# Patient Record
Sex: Male | Born: 1945 | Race: Black or African American | Hispanic: No | Marital: Married | Smoking: Never smoker
Health system: Southern US, Community
[De-identification: ages and names within clinical notes are randomized; demographics above are authoritative.]

## PROBLEM LIST (undated history)

## (undated) DIAGNOSIS — M109 Gout, unspecified: Secondary | ICD-10-CM

## (undated) DIAGNOSIS — G4730 Sleep apnea, unspecified: Secondary | ICD-10-CM

## (undated) DIAGNOSIS — J302 Other seasonal allergic rhinitis: Secondary | ICD-10-CM

## (undated) DIAGNOSIS — I10 Essential (primary) hypertension: Secondary | ICD-10-CM

## (undated) HISTORY — PX: CIRCUMCISION: SUR203

## (undated) HISTORY — PX: PITUITARY EXCISION: SHX745

## (undated) HISTORY — PX: CATARACT EXTRACTION: SUR2

## (undated) HISTORY — PX: LAPAROSCOPIC GASTRIC BANDING: SHX1100

---

## 2012-03-28 ENCOUNTER — Encounter (HOSPITAL_BASED_OUTPATIENT_CLINIC_OR_DEPARTMENT_OTHER): Payer: Self-pay | Admitting: Emergency Medicine

## 2012-03-28 ENCOUNTER — Emergency Department (HOSPITAL_BASED_OUTPATIENT_CLINIC_OR_DEPARTMENT_OTHER): Payer: Medicare HMO

## 2012-03-28 ENCOUNTER — Emergency Department (HOSPITAL_BASED_OUTPATIENT_CLINIC_OR_DEPARTMENT_OTHER)
Admission: EM | Admit: 2012-03-28 | Discharge: 2012-03-28 | Disposition: A | Discharge status: Home / Self Care | Payer: Medicare HMO | Attending: Emergency Medicine | Admitting: Emergency Medicine

## 2012-03-28 DIAGNOSIS — Z9109 Other allergy status, other than to drugs and biological substances: Secondary | ICD-10-CM | POA: Insufficient documentation

## 2012-03-28 DIAGNOSIS — X58XXXA Exposure to other specified factors, initial encounter: Secondary | ICD-10-CM | POA: Insufficient documentation

## 2012-03-28 DIAGNOSIS — E119 Type 2 diabetes mellitus without complications: Secondary | ICD-10-CM | POA: Insufficient documentation

## 2012-03-28 DIAGNOSIS — I10 Essential (primary) hypertension: Secondary | ICD-10-CM | POA: Insufficient documentation

## 2012-03-28 DIAGNOSIS — G4730 Sleep apnea, unspecified: Secondary | ICD-10-CM | POA: Insufficient documentation

## 2012-03-28 DIAGNOSIS — S3130XA Unspecified open wound of scrotum and testes, initial encounter: Secondary | ICD-10-CM | POA: Insufficient documentation

## 2012-03-28 HISTORY — DX: Other seasonal allergic rhinitis: J30.2

## 2012-03-28 HISTORY — DX: Sleep apnea, unspecified: G47.30

## 2012-03-28 HISTORY — DX: Essential (primary) hypertension: I10

## 2012-03-28 HISTORY — DX: Gout, unspecified: M10.9

## 2012-03-28 LAB — URINALYSIS, ROUTINE W REFLEX MICROSCOPIC
Bilirubin Urine: NEGATIVE
Hgb urine dipstick: NEGATIVE
Protein, ur: NEGATIVE mg/dL
Urobilinogen, UA: 0.2 mg/dL (ref 0.0–1.0)

## 2012-03-28 MED ORDER — OXYCODONE-ACETAMINOPHEN 5-325 MG PO TABS: 2.0000 | ORAL_TABLET | ORAL | Status: AC | PRN

## 2012-03-28 MED ORDER — DOXYCYCLINE HYCLATE 100 MG PO CAPS: 100.0000 mg | ORAL_CAPSULE | Freq: Two times a day (BID) | ORAL | Status: AC

## 2012-03-28 NOTE — ED Notes (Signed)
Pt had boil on testicles that burst approximately 3 months ago.  Pt. placed bandaid on area to prevent pain and catch drainage.  Area developed scab and has been bothering pt. and is now seeking medical attention.  Area is draining currently.  Pt. also complains of continual urination at night for the past six months.  Pt. Stated that the doctor gave him medication for the urination but has not helped.  Wife states that the physician gave him Zolpidem for the problem but has not helped.

## 2012-03-28 NOTE — ED Provider Notes (Signed)
History     CSN: [id number]  Arrival date & time 03/28/12  1134   First MD Initiated Contact with Patient 03/28/12 1209      Chief Complaint  Patient presents with  . Recurrent Skin Infections    boil on testicle    (Consider location/radiation/quality/duration/timing/severity/associated sxs/prior treatment) Patient is a 66 y.o. male presenting with testicular pain. The history is provided by the patient. No language interpreter was used.  Testicle Pain This is a recurrent problem. Episode onset: 3 months. The problem occurs constantly. The problem has been gradually worsening. Pertinent negatives include no abdominal pain, change in bowel habit or weakness. Nothing aggravates the symptoms. Treatments tried: antibiotic. The treatment provided no relief.  Pt reports he has a sore on his scrotum.   Pt reports area burst and drained.   Dr. Roger Shelter treated him with an antibiotic.   Pt reports area is still open.   Area has not healed.     Past Medical History  Diagnosis Date  . Diabetes mellitus   . Hypertension   . Gout   . Seasonal allergies   . Sleep apnea     Past Surgical History  Procedure Date  . Pituitary excision   . Laparoscopic gastric banding   . Circumcision     History reviewed. No pertinent family history.  History  Substance Use Topics  . Smoking status: Never Smoker   . Smokeless tobacco: Never Used  . Alcohol Use: No      Review of Systems  Gastrointestinal: Negative for abdominal pain and change in bowel habit.  Genitourinary: Positive for testicular pain.  Neurological: Negative for weakness.  All other systems reviewed and are negative.    Allergies  Review of patient's allergies indicates no known allergies.  Home Medications   Current Outpatient Rx  Name Route Sig Dispense Refill  . DIOVAN PO Oral Take by mouth.    . ZOLPIDEM TARTRATE 10 MG PO TABS Oral Take by mouth.      BP 128/67  Pulse 125  Temp 98.4 F (36.9 C) (Oral)  Resp  24  Ht 6' (1.829 m)  Wt 487 lb (220.902 kg)  BMI 66.05 kg/m2  SpO2 93%  Physical Exam  Constitutional: He is oriented to person, place, and time. He appears well-developed.  HENT:  Head: Normocephalic and atraumatic.  Eyes: Pupils are equal, round, and reactive to light.  Neck: Normal range of motion. Neck supple.  Cardiovascular: Normal rate, regular rhythm and normal heart sounds.   Pulmonary/Chest: Effort normal.  Abdominal: Soft. Bowel sounds are normal. There is tenderness.  Genitourinary:       Quarter sized open wound,  No drainage,  2 other small open wounds,    Musculoskeletal: Normal range of motion.  Neurological: He is alert and oriented to person, place, and time. He has normal reflexes.  Skin: Skin is warm and dry.  Psychiatric: He has a normal mood and affect.    ED Course  Procedures (including critical care time)  Labs Reviewed - No data to display No results found.   1. Wound, open, scrotum or testes       MDM  No cellulitis,  Nonhealing wound.   Ultrasound shows hydrocele but no other mass.   I will treat with doxycycline to cover infection.   I advised follow up with Dr. Marcello Fennel for urine flow problems.   See Dr. Roger Shelter for wound recheck/rechecks.        Elson Areas, PA  03/28/12 1425  Lonia Skinner [place], [place] 03/28/12 1500

## 2012-03-28 NOTE — ED Notes (Signed)
Gave pt. Water to drink to aid in urination

## 2012-03-29 NOTE — ED Provider Notes (Signed)
Medical screening examination/treatment/procedure(s) were performed by non-physician practitioner and as supervising physician I was immediately available for consultation/collaboration.  Toy Baker, MD 03/29/12 [phone]

## 2012-11-02 ENCOUNTER — Emergency Department (HOSPITAL_BASED_OUTPATIENT_CLINIC_OR_DEPARTMENT_OTHER)
Admission: EM | Admit: 2012-11-02 | Discharge: 2012-11-02 | Disposition: A | Discharge status: Home / Self Care | Payer: Medicare HMO | Attending: Emergency Medicine | Admitting: Emergency Medicine

## 2012-11-02 ENCOUNTER — Encounter (HOSPITAL_BASED_OUTPATIENT_CLINIC_OR_DEPARTMENT_OTHER): Payer: Self-pay

## 2012-11-02 DIAGNOSIS — N498 Inflammatory disorders of other specified male genital organs: Secondary | ICD-10-CM | POA: Insufficient documentation

## 2012-11-02 DIAGNOSIS — Z794 Long term (current) use of insulin: Secondary | ICD-10-CM | POA: Insufficient documentation

## 2012-11-02 DIAGNOSIS — Z23 Encounter for immunization: Secondary | ICD-10-CM | POA: Insufficient documentation

## 2012-11-02 DIAGNOSIS — Z79899 Other long term (current) drug therapy: Secondary | ICD-10-CM | POA: Insufficient documentation

## 2012-11-02 DIAGNOSIS — I10 Essential (primary) hypertension: Secondary | ICD-10-CM | POA: Insufficient documentation

## 2012-11-02 DIAGNOSIS — E119 Type 2 diabetes mellitus without complications: Secondary | ICD-10-CM | POA: Insufficient documentation

## 2012-11-02 DIAGNOSIS — W19XXXA Unspecified fall, initial encounter: Secondary | ICD-10-CM | POA: Insufficient documentation

## 2012-11-02 DIAGNOSIS — Z8639 Personal history of other endocrine, nutritional and metabolic disease: Secondary | ICD-10-CM | POA: Insufficient documentation

## 2012-11-02 DIAGNOSIS — Y929 Unspecified place or not applicable: Secondary | ICD-10-CM | POA: Insufficient documentation

## 2012-11-02 DIAGNOSIS — Y939 Activity, unspecified: Secondary | ICD-10-CM | POA: Insufficient documentation

## 2012-11-02 DIAGNOSIS — Z043 Encounter for examination and observation following other accident: Secondary | ICD-10-CM | POA: Insufficient documentation

## 2012-11-02 DIAGNOSIS — L0390 Cellulitis, unspecified: Secondary | ICD-10-CM

## 2012-11-02 DIAGNOSIS — Z862 Personal history of diseases of the blood and blood-forming organs and certain disorders involving the immune mechanism: Secondary | ICD-10-CM | POA: Insufficient documentation

## 2012-11-02 MED ORDER — TETANUS-DIPHTH-ACELL PERTUSSIS 5-2.5-18.5 LF-MCG/0.5 IM SUSP
0.5000 mL | Freq: Once | INTRAMUSCULAR | Status: AC
  Administered 2012-11-02: 0.5 mL via INTRAMUSCULAR
  Filled 2012-11-02: qty 0.5

## 2012-11-02 MED ORDER — DOXYCYCLINE HYCLATE 100 MG PO CAPS: 100.0000 mg | ORAL_CAPSULE | Freq: Two times a day (BID) | ORAL | Status: AC

## 2012-11-02 NOTE — ED Provider Notes (Signed)
History     CSN: [id number]  Arrival date & time 11/02/12  1454   First MD Initiated Contact with Patient 11/02/12 1512      Chief Complaint  Patient presents with  . Fall  . Abscess    (Consider location/radiation/quality/duration/timing/severity/associated sxs/prior treatment) HPI Comments: Patient presents with sores on his scrotal area. He states it's been coming on for about 3 weeks now. He states he had it once before in September and it cleared up after a course of antibiotics. he's he states it's been draining and also bleeding intermittently. Patient denies any fevers or chills. He denies any nausea or vomiting. On the triage note he was also complaining of a fall. He apparently had a fall 4 days ago but he denies any injuries from the fall. He denies any complaints of pain currently other than the sores in his scrotum. Patient is morbidly obese and is only ambulatory with assistance.  Patient is a 67 y.o. male presenting with fall and abscess.  Fall Pertinent negatives include no fever, no numbness, no abdominal pain, no nausea, no vomiting, no hematuria and no headaches.  Abscess Associated symptoms: no fatigue, no fever, no headaches, no nausea and no vomiting     Past Medical History  Diagnosis Date  . Diabetes mellitus   . Hypertension   . Gout   . Seasonal allergies   . Sleep apnea     Past Surgical History  Procedure Laterality Date  . Pituitary excision    . Laparoscopic gastric banding    . Circumcision      No family history on file.  History  Substance Use Topics  . Smoking status: Never Smoker   . Smokeless tobacco: Never Used  . Alcohol Use: No      Review of Systems  Constitutional: Negative for fever, chills, diaphoresis and fatigue.  HENT: Negative for congestion, rhinorrhea and sneezing.   Eyes: Negative.   Respiratory: Negative for cough, chest tightness and shortness of breath.   Cardiovascular: Negative for chest pain and leg  swelling.  Gastrointestinal: Negative for nausea, vomiting, abdominal pain, diarrhea and blood in stool.  Genitourinary: Negative for frequency, hematuria, flank pain and difficulty urinating.  Musculoskeletal: Negative for back pain and arthralgias.  Skin: Positive for wound. Negative for rash.  Neurological: Negative for dizziness, speech difficulty, weakness, numbness and headaches.    Allergies  Review of patient's allergies indicates no known allergies.  Home Medications   Current Outpatient Rx  Name  Route  Sig  Dispense  Refill  . insulin NPH-regular (NOVOLIN 70/30) (70-30) 100 UNIT/ML injection   Subcutaneous   Inject into the skin.         Marland Kitchen METFORMIN HCL PO   Oral   Take by mouth.         . TORSEMIDE PO   Oral   Take by mouth.         Marland Kitchen UNKNOWN TO PATIENT      Pt did not bring in meds-wife was able to list some-not  all-knows he takes BP meds         . doxycycline (VIBRAMYCIN) 100 MG capsule   Oral   Take 1 capsule (100 mg total) by mouth 2 (two) times daily.   20 capsule   0   . Valsartan (DIOVAN PO)   Oral   Take by mouth.         . zolpidem (AMBIEN) 10 MG tablet   Oral   Take  by mouth.           BP 107/60  Pulse 118  Temp(Src) 98 F (36.7 C) (Oral)  Resp 20  Ht 6' (1.829 m)  Wt 487 lb (220.902 kg)  BMI 66.03 kg/m2  SpO2 100%  Physical Exam  Constitutional: He is oriented to person, place, and time. He appears well-developed and well-nourished.  HENT:  Head: Normocephalic and atraumatic.  Eyes: Pupils are equal, round, and reactive to light.  Neck: Normal range of motion. Neck supple.  Cardiovascular: Normal rate, regular rhythm and normal heart sounds.   Pulmonary/Chest: Effort normal and breath sounds normal. No respiratory distress. He has no wheezes. He has no rales. He exhibits no tenderness.  Abdominal: Soft. Bowel sounds are normal. There is no tenderness. There is no rebound and no guarding.  Musculoskeletal: Normal  range of motion. He exhibits no edema.  Lymphadenopathy:    He has no cervical adenopathy.  Neurological: He is alert and oriented to person, place, and time.  Skin: Skin is warm and dry. No rash noted.  Patient has markedly enlarged scrotal sac which is chronic per the patient. He has some skin breakdown on the underside of his scrotum  is mildly erythematous. There is some serous sanguinous discharge. There is no induration or fluctuance. There is no surrounding cellulitis. There's no tenderness to his testicles. There is no extension into the perirectal area  Psychiatric: He has a normal mood and affect.    ED Course  Procedures (including critical care time)  Labs Reviewed - No data to display No results found.   1. Cellulitis       MDM  Patient with a small area of skin breakdown in his scrotal sac. He does state he's had this one time in the past and on review of records she was treated with doxycycline which seemed to clear it up. He did say that took a long time for it to heal. He appears to be an area that is rubbing when he moves around. A protective dressing was placed to the area. I will start him on doxycycline. His tetanus shot was updated. I advised him to followup with his primary care physician within the next 2-3 days for recheck. Advised to return here for symptoms worsen.        Rolan Bucco, MD 11/02/12 1538

## 2012-11-02 NOTE — ED Notes (Signed)
pt has "boils on is testicle" x 2 weeks-also c/o fall x 4 days pt states "i'm just sore from them pulling me up not hurting from the fall"

## 2013-05-29 ENCOUNTER — Encounter (HOSPITAL_BASED_OUTPATIENT_CLINIC_OR_DEPARTMENT_OTHER): Payer: Self-pay | Admitting: Emergency Medicine

## 2013-05-29 ENCOUNTER — Emergency Department (HOSPITAL_BASED_OUTPATIENT_CLINIC_OR_DEPARTMENT_OTHER)
Admission: EM | Admit: 2013-05-29 | Discharge: 2013-05-29 | Disposition: A | Discharge status: Home / Self Care | Payer: Medicare Other | Attending: Emergency Medicine | Admitting: Emergency Medicine

## 2013-05-29 DIAGNOSIS — Z862 Personal history of diseases of the blood and blood-forming organs and certain disorders involving the immune mechanism: Secondary | ICD-10-CM | POA: Insufficient documentation

## 2013-05-29 DIAGNOSIS — Z79899 Other long term (current) drug therapy: Secondary | ICD-10-CM | POA: Insufficient documentation

## 2013-05-29 DIAGNOSIS — L0390 Cellulitis, unspecified: Secondary | ICD-10-CM

## 2013-05-29 DIAGNOSIS — F430 Acute stress reaction: Secondary | ICD-10-CM | POA: Insufficient documentation

## 2013-05-29 DIAGNOSIS — E119 Type 2 diabetes mellitus without complications: Secondary | ICD-10-CM | POA: Insufficient documentation

## 2013-05-29 DIAGNOSIS — Z8639 Personal history of other endocrine, nutritional and metabolic disease: Secondary | ICD-10-CM | POA: Insufficient documentation

## 2013-05-29 DIAGNOSIS — I10 Essential (primary) hypertension: Secondary | ICD-10-CM | POA: Insufficient documentation

## 2013-05-29 DIAGNOSIS — Z794 Long term (current) use of insulin: Secondary | ICD-10-CM | POA: Insufficient documentation

## 2013-05-29 DIAGNOSIS — G4730 Sleep apnea, unspecified: Secondary | ICD-10-CM | POA: Insufficient documentation

## 2013-05-29 MED ORDER — OXYCODONE-ACETAMINOPHEN 5-325 MG PO TABS
2.0000 | ORAL_TABLET | Freq: Once | ORAL | Status: AC
  Administered 2013-05-29: 2 via ORAL
  Filled 2013-05-29: qty 2

## 2013-05-29 MED ORDER — METRONIDAZOLE 500 MG PO TABS: 500.0000 mg | ORAL_TABLET | Freq: Two times a day (BID) | ORAL | Status: AC

## 2013-05-29 MED ORDER — OXYCODONE-ACETAMINOPHEN 5-325 MG PO TABS: 2.0000 | ORAL_TABLET | ORAL | Status: DC | PRN

## 2013-05-29 MED ORDER — CIPROFLOXACIN HCL 500 MG PO TABS
500.0000 mg | ORAL_TABLET | Freq: Once | ORAL | Status: AC
Start: 2013-05-29 — End: 2013-05-29
  Administered 2013-05-29: 500 mg via ORAL
  Filled 2013-05-29: qty 1

## 2013-05-29 MED ORDER — LEVOFLOXACIN 500 MG PO TABS: 500.0000 mg | ORAL_TABLET | Freq: Every day | ORAL | Status: AC

## 2013-05-29 NOTE — ED Notes (Signed)
rx x 3 sent to pharmacy- pt advised where to pick up medications- pt has a ride at bedside

## 2013-05-29 NOTE — ED Provider Notes (Signed)
CSN: [id number]     Arrival date & time 05/29/13  1047 History   First MD Initiated Contact with Patient 05/29/13 1114     Chief Complaint  Patient presents with  . Recurrent Skin Infections    HPI  This is a patient with a complain of some pain in the posterior aspect of the scrotum. He has had a history of soft tissue infection some in his groin. Never in his axilla. Some perirectal and perirenal. He had a formal procedure the operating room several years ago for incision and drainage of a rectal or pelvic abscess. Last to 3 days he noticed a little bit of drainage on his shorts has some discomfort in the posterior aspect of the scrotum. He is an insulin-dependent diabetic. He is 490 pounds.  Past Medical History  Diagnosis Date  . Diabetes mellitus   . Hypertension   . Gout   . Seasonal allergies   . Sleep apnea    Past Surgical History  Procedure Laterality Date  . Pituitary excision    . Laparoscopic gastric banding    . Circumcision    . Cataract extraction     No family history on file. History  Substance Use Topics  . Smoking status: Never Smoker   . Smokeless tobacco: Never Used  . Alcohol Use: No    Review of Systems  Constitutional: Negative for fever, chills, diaphoresis, appetite change and fatigue.  HENT: Negative for mouth sores, sore throat and trouble swallowing.   Eyes: Negative for visual disturbance.  Respiratory: Negative for cough, chest tightness, shortness of breath and wheezing.   Cardiovascular: Negative for chest pain.  Gastrointestinal: Negative for nausea, vomiting, abdominal pain, diarrhea and abdominal distention.  Endocrine: Negative for polydipsia, polyphagia and polyuria.  Genitourinary: Negative for dysuria, frequency and hematuria.  Musculoskeletal: Negative for gait problem.  Skin: Positive for wound. Negative for color change, pallor and rash.       Skin breakdown the posterior aspect of the scrotum per his family members report   Neurological: Negative for dizziness, syncope, light-headedness and headaches.  Hematological: Does not bruise/bleed easily.  Psychiatric/Behavioral: Negative for behavioral problems and confusion.    Allergies  Review of patient's allergies indicates no known allergies.  Home Medications   Current Outpatient Rx  Name  Route  Sig  Dispense  Refill  . doxycycline (VIBRAMYCIN) 100 MG capsule   Oral   Take 1 capsule (100 mg total) by mouth 2 (two) times daily.   20 capsule   0   . insulin NPH-regular (NOVOLIN 70/30) (70-30) 100 UNIT/ML injection   Subcutaneous   Inject into the skin.         Marland Kitchen levofloxacin (LEVAQUIN) 500 MG tablet   Oral   Take 1 tablet (500 mg total) by mouth daily.   10 tablet   0   . METFORMIN HCL PO   Oral   Take by mouth.         . metroNIDAZOLE (FLAGYL) 500 MG tablet   Oral   Take 1 tablet (500 mg total) by mouth 2 (two) times daily.   20 tablet   0   . oxyCODONE-acetaminophen (PERCOCET/ROXICET) 5-325 MG per tablet   Oral   Take 2 tablets by mouth every 4 (four) hours as needed.   6 tablet   0   . TORSEMIDE PO   Oral   Take by mouth.         Marland Kitchen UNKNOWN TO PATIENT  Pt did not bring in meds-wife was able to list some-not  all-knows he takes BP meds         . Valsartan (DIOVAN PO)   Oral   Take by mouth.         . zolpidem (AMBIEN) 10 MG tablet   Oral   Take by mouth.          BP 144/95  Pulse 115  Temp(Src) 97.8 F (36.6 C) (Oral)  Resp 20  Ht 6' (1.829 m)  Wt 490 lb (222.263 kg)  BMI 66.44 kg/m2  SpO2 97% Physical Exam  Constitutional: He is oriented to person, place, and time. He appears well-developed and well-nourished. No distress.  Large stature adult male. Has difficulty getting in and out of bed based on his size  HENT:  Head: Normocephalic.  Eyes: Conjunctivae are normal. Pupils are equal, round, and reactive to light. No scleral icterus.  Neck: Normal range of motion. Neck supple. No thyromegaly  present.  Cardiovascular: Normal rate and regular rhythm.  Exam reveals no gallop and no friction rub.   No murmur heard. Pulmonary/Chest: Effort normal and breath sounds normal. No respiratory distress. He has no wheezes. He has no rales.  Abdominal: Soft. Bowel sounds are normal. He exhibits no distension. There is no tenderness. There is no rebound.  Morbid obesity no anterior abdominal wall abnormalities. Anterior scrotum appears normal cornea appears normal  Genitourinary:  He has a lot of redundant tissue in his perineum. Is able to examine him in the left lateral decubitus position and in the standing position. I do not see evidence of Fournier's. I do not see obvious skin breakdown or cellulitis. There is an area less than the size of a dime or a some granulation tissue on the left most posterior hemiscrotum. This is skin is not indurated. There is no palpable fluctuance. There is no palpable drainage. No visible sinus. The remainder of the skin does not appear compromised. I do not find perirectal, perianal, or buttock abscesses on exam.  Musculoskeletal: Normal range of motion.  Neurological: He is alert and oriented to person, place, and time.  Skin: Skin is warm and dry. No rash noted.  Psychiatric: He has a normal mood and affect. His behavior is normal.    ED Course  Procedures (including critical care time) Labs Review Labs Reviewed - No data to display Imaging Review No results found.  EKG Interpretation   None       MDM   1. Cellulitis    Careful exam shows some early breakdown of extreme posterior aspect of the scrotal skin to the left. I do not find evidence of additional skin breakdown that would suggest fornier's gangrene.  No additional signs of cellulitis. No palpable perianal, or perirectal abscesses. 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  He is diabetic and he is nearly 500 pounds and has  a lot of redundant tissue in this area. I do not see any sign that would suggest a need imaging, constipation or additional treatment right now. Have asked that he be compliant with his antibiotics. He may see his primary care physician within the next 2-3 days if this is not continuing to improve.    Roney Marion, MD 05/29/13 1200

## 2013-05-29 NOTE — ED Notes (Signed)
Pt reports he has cyst to perineal area- hx of same

## 2013-05-29 NOTE — ED Notes (Signed)
MD at bedside. 

## 2014-05-22 ENCOUNTER — Encounter (HOSPITAL_BASED_OUTPATIENT_CLINIC_OR_DEPARTMENT_OTHER): Payer: Medicare HMO

## 2014-05-23 ENCOUNTER — Emergency Department (HOSPITAL_COMMUNITY)
Admission: EM | Admit: 2014-05-23 | Discharge: 2014-05-23 | Disposition: A | Discharge status: Home / Self Care | Payer: Medicare Other | Attending: Emergency Medicine | Admitting: Emergency Medicine

## 2014-05-23 ENCOUNTER — Encounter (HOSPITAL_COMMUNITY): Payer: Self-pay | Admitting: *Deleted

## 2014-05-23 ENCOUNTER — Emergency Department (HOSPITAL_COMMUNITY): Payer: Medicare Other

## 2014-05-23 DIAGNOSIS — I10 Essential (primary) hypertension: Secondary | ICD-10-CM | POA: Insufficient documentation

## 2014-05-23 DIAGNOSIS — L089 Local infection of the skin and subcutaneous tissue, unspecified: Secondary | ICD-10-CM | POA: Diagnosis present

## 2014-05-23 DIAGNOSIS — Z8709 Personal history of other diseases of the respiratory system: Secondary | ICD-10-CM | POA: Insufficient documentation

## 2014-05-23 DIAGNOSIS — L97509 Non-pressure chronic ulcer of other part of unspecified foot with unspecified severity: Secondary | ICD-10-CM

## 2014-05-23 DIAGNOSIS — Z792 Long term (current) use of antibiotics: Secondary | ICD-10-CM | POA: Insufficient documentation

## 2014-05-23 DIAGNOSIS — L97519 Non-pressure chronic ulcer of other part of right foot with unspecified severity: Secondary | ICD-10-CM | POA: Diagnosis not present

## 2014-05-23 DIAGNOSIS — L89152 Pressure ulcer of sacral region, stage 2: Secondary | ICD-10-CM | POA: Insufficient documentation

## 2014-05-23 DIAGNOSIS — Z794 Long term (current) use of insulin: Secondary | ICD-10-CM | POA: Insufficient documentation

## 2014-05-23 DIAGNOSIS — M109 Gout, unspecified: Secondary | ICD-10-CM | POA: Insufficient documentation

## 2014-05-23 DIAGNOSIS — Z79899 Other long term (current) drug therapy: Secondary | ICD-10-CM | POA: Diagnosis not present

## 2014-05-23 DIAGNOSIS — E11621 Type 2 diabetes mellitus with foot ulcer: Secondary | ICD-10-CM | POA: Diagnosis not present

## 2014-05-23 DIAGNOSIS — L97529 Non-pressure chronic ulcer of other part of left foot with unspecified severity: Secondary | ICD-10-CM | POA: Insufficient documentation

## 2014-05-23 DIAGNOSIS — G4730 Sleep apnea, unspecified: Secondary | ICD-10-CM | POA: Diagnosis not present

## 2014-05-23 DIAGNOSIS — L8990 Pressure ulcer of unspecified site, unspecified stage: Secondary | ICD-10-CM

## 2014-05-23 DIAGNOSIS — Z7982 Long term (current) use of aspirin: Secondary | ICD-10-CM | POA: Diagnosis not present

## 2014-05-23 LAB — BASIC METABOLIC PANEL
ANION GAP: 11 (ref 5–15)
BUN: 43 mg/dL — ABNORMAL HIGH (ref 6–23)
CALCIUM: 9.2 mg/dL (ref 8.4–10.5)
CHLORIDE: 102 meq/L (ref 96–112)
CO2: 28 mEq/L (ref 19–32)
CREATININE: 1.6 mg/dL — AB (ref 0.50–1.35)
GFR calc non Af Amer: 43 mL/min — ABNORMAL LOW (ref >90)
GFR, EST AFRICAN AMERICAN: 50 mL/min — AB (ref >90)
Glucose, Bld: 56 mg/dL — ABNORMAL LOW (ref 70–99)
Potassium: 5 mEq/L (ref 3.7–5.3)
SODIUM: 141 meq/L (ref 137–147)

## 2014-05-23 LAB — CBC WITH DIFFERENTIAL/PLATELET
BASOS ABS: 0.1 10*3/uL (ref 0.0–0.1)
BASOS PCT: 1 % (ref 0–1)
EOS PCT: 2 % (ref 0–5)
Eosinophils Absolute: 0.2 10*3/uL (ref 0.0–0.7)
HEMATOCRIT: 32.8 % — AB (ref 39.0–52.0)
Hemoglobin: 10.1 g/dL — ABNORMAL LOW (ref 13.0–17.0)
Lymphocytes Relative: 20 % (ref 12–46)
Lymphs Abs: 1.8 10*3/uL (ref 0.7–4.0)
MCH: 27.9 pg (ref 26.0–34.0)
MCHC: 30.8 g/dL (ref 30.0–36.0)
MCV: 90.6 fL (ref 78.0–100.0)
MONO ABS: 0.5 10*3/uL (ref 0.1–1.0)
Monocytes Relative: 6 % (ref 3–12)
NEUTROS ABS: 6.2 10*3/uL (ref 1.7–7.7)
Neutrophils Relative %: 71 % (ref 43–77)
PLATELETS: 304 10*3/uL (ref 150–400)
RBC: 3.62 MIL/uL — ABNORMAL LOW (ref 4.22–5.81)
RDW: 15.1 % (ref 11.5–15.5)
WBC: 8.7 10*3/uL (ref 4.0–10.5)

## 2014-05-23 LAB — CBG MONITORING, ED: GLUCOSE-CAPILLARY: 46 mg/dL — AB (ref 70–99)

## 2014-05-23 MED ORDER — AMOXICILLIN-POT CLAVULANATE 875-125 MG PO TABS: 1.0000 | ORAL_TABLET | Freq: Two times a day (BID) | ORAL | Status: AC

## 2014-05-23 MED ORDER — OXYCODONE-ACETAMINOPHEN 5-325 MG PO TABS: 1.0000 | ORAL_TABLET | Freq: Four times a day (QID) | ORAL | Status: AC | PRN

## 2014-05-23 MED ORDER — HYDROCODONE-ACETAMINOPHEN 5-325 MG PO TABS
2.0000 | ORAL_TABLET | Freq: Once | ORAL | Status: AC
  Administered 2014-05-23: 2 via ORAL
  Filled 2014-05-23: qty 2

## 2014-05-23 NOTE — ED Notes (Signed)
EDP at bedside  

## 2014-05-23 NOTE — Discharge Instructions (Signed)
Soak feet in warm water, use moist soaks on pressure ulcers. Follow-up with your primary care physician, and follow-up with the wound clinic. Return to the ER if you develop worsening of symptoms, high fever, nausea, vomiting  Pressure Ulcer A pressure ulcer is a sore that has formed from the breakdown of skin and exposure of deeper layers of tissue. It develops in areas of the body where there is unrelieved pressure. Pressure ulcers are usually found over a bony area, such as the shoulder blades, spine, lower back, hips, knees, ankles, and heels. Pressure ulcers vary in severity. Your health care provider may determine the severity (stage) of your pressure ulcer. The stages include:  Stage I--The skin is red, and when the skin is pressed, it stays red.  Stage II--The top layer of skin is gone, and there is a shallow, pink ulcer.  Stage III--The ulcer becomes deeper, and it is more difficult to see the whole wound. Also, there may be yellow or brown parts, as well as pink and red parts.  Stage IV--The ulcer may be deep and red, pink, brown, white, or yellow. Bone or muscle may be seen.  Unstageable pressure ulcer--The ulcer is covered almost completely with black, brown, or yellow tissue. It is not known how deep the ulcer is or what stage it is until this covering comes off.  Suspected deep tissue injury--A person's skin can be injured from pressure or pulling on the skin when his or her position is changed. The skin appears purple or maroon. There may not be an opening in the skin, but there could be a blood-filled blister. This deep tissue injury is often difficult to see in people with darker skin tones. The site may open and become deeper in time. However, early interventions will help the area heal and may prevent the area from opening. CAUSES  Pressure ulcers are caused by pressure against the skin that limits the flow of blood to the skin and nearby tissues. There are many risk factors that  can lead to pressure sores. RISK FACTORS  Decreased ability to move.  Decreased ability to feel pain or discomfort.  Excessive skin moisture from urine, stool, sweat, or secretions.  Poor nutrition.  Dehydration.  Tobacco, drug, or alcohol abuse.  Having someone pull on bedsheets that are under you, such as when health care workers are changing your position in a hospital bed.  Obesity.  Increased adult age.  Hospitalization in a critical care unit for longer than 4 days with use of medical devices.  Prolonged use of medical devices.  Critical illness.  Anemia.  Traumatic brain injury.  Spinal cord injury.  Stroke.  Diabetes.  Poor blood glucose control.  Low blood pressure (hypotension).  Low oxygen levels.  Medicines that reduce blood flow.  Infection. DIAGNOSIS  Your health care provider will diagnose your pressure ulcer based on its appearance. The health care provider may determine the stage of your pressure ulcer as well. Tests may be done to check for infection, to assess your circulation, or to check for other diseases, such as diabetes. TREATMENT  Treatment of your pressure ulcer begins with determining what stage the ulcer is in. Your treatment team may include your health care provider, a wound care specialist, a nutritionist, a physical therapist, and a Psychologist, sport and exercise. Possible treatments may include:   Moving or repositioning every 1-2 hours.  Using beds or mattresses to shift your body weight and pressure points frequently.  Improving your diet.  Cleaning and  bandaging (dressing) the open wound.  Giving antibiotic medicines.  Removing damaged tissue.  Surgery and sometimes skin grafts. HOME CARE INSTRUCTIONS  If you were hospitalized, follow the care plan that was started in the hospital.  Avoid staying in the same position for more than 2 hours. Use padding, devices, or mattresses to cushion your pressure points as directed by your health  care provider.  Eat a well-balanced diet. Take nutritional supplements and vitamins as directed by your health care provider.  Keep all follow-up appointments.  Only take over-the-counter or prescription medicines for pain, fever, or discomfort as directed by your health care provider. SEEK MEDICAL CARE IF:   Your pressure ulcer is not improving.  You do not know how to care for your pressure ulcer.  You notice other areas of redness on your skin.  You have a fever. SEEK IMMEDIATE MEDICAL CARE IF:   You have increasing redness, swelling, or pain in your pressure ulcer.  You notice pus coming from your pressure ulcer.  You notice a bad smell coming from the wound or dressing.  Your pressure ulcer opens up again. Document Released: 07/05/2005 Document Revised: 07/10/2013 Document Reviewed: 03/12/2013 [facility] Patient Information 2015 [place], [place]. This information is not intended to replace advice given to you by your health care provider. Make sure you discuss any questions you have with your health care provider.  Diabetes and Foot Care Diabetes may cause you to have problems because of poor blood supply (circulation) to your feet and legs. This may cause the skin on your feet to become thinner, break easier, and heal more slowly. Your skin may become dry, and the skin may peel and crack. You may also have nerve damage in your legs and feet causing decreased feeling in them. You may not notice minor injuries to your feet that could lead to infections or more serious problems. Taking care of your feet is one of the most important things you can do for yourself.  HOME CARE INSTRUCTIONS  Wear shoes at all times, even in the house. Do not go barefoot. Bare feet are easily injured.  Check your feet daily for blisters, cuts, and redness. If you cannot see the bottom of your feet, use a mirror or ask someone for help.  Wash your feet with warm water (do not use hot water) and mild  soap. Then pat your feet and the areas between your toes until they are completely dry. Do not soak your feet as this can dry your skin.  Apply a moisturizing lotion or petroleum jelly (that does not contain alcohol and is unscented) to the skin on your feet and to dry, brittle toenails. Do not apply lotion between your toes.  Trim your toenails straight across. Do not dig under them or around the cuticle. File the edges of your nails with an emery board or nail file.  Do not cut corns or calluses or try to remove them with medicine.  Wear clean socks or stockings every day. Make sure they are not too tight. Do not wear knee-high stockings since they may decrease blood flow to your legs.  Wear shoes that fit properly and have enough cushioning. To break in new shoes, wear them for just a few hours a day. This prevents you from injuring your feet. Always look in your shoes before you put them on to be sure there are no objects inside.  Do not cross your legs. This may decrease the blood flow to your  feet.  If you find a minor scrape, cut, or break in the skin on your feet, keep it and the skin around it clean and dry. These areas may be cleansed with mild soap and water. Do not cleanse the area with peroxide, alcohol, or iodine.  When you remove an adhesive bandage, be sure not to damage the skin around it.  If you have a wound, look at it several times a day to make sure it is healing.  Do not use heating pads or hot water bottles. They may burn your skin. If you have lost feeling in your feet or legs, you may not know it is happening until it is too late.  Make sure your health care provider performs a complete foot exam at least annually or more often if you have foot problems. Report any cuts, sores, or bruises to your health care provider immediately. SEEK MEDICAL CARE IF:   You have an injury that is not healing.  You have cuts or breaks in the skin.  You have an ingrown  nail.  You notice redness on your legs or feet.  You feel burning or tingling in your legs or feet.  You have pain or cramps in your legs and feet.  Your legs or feet are numb.  Your feet always feel cold. SEEK IMMEDIATE MEDICAL CARE IF:   There is increasing redness, swelling, or pain in or around a wound.  There is a red line that goes up your leg.  Pus is coming from a wound.  You develop a fever or as directed by your health care provider.  You notice a bad smell coming from an ulcer or wound. Document Released: 07/02/2000 Document Revised: 03/07/2013 Document Reviewed: 12/12/2012 [facility] Patient Information 2015 [place], [place]. This information is not intended to replace advice given to you by your health care provider. Make sure you discuss any questions you have with your health care provider.

## 2014-05-23 NOTE — ED Provider Notes (Signed)
CSN: [id number]     Arrival date & time 05/23/14  1248 History   First MD Initiated Contact with Patient 05/23/14 1625     Chief Complaint  Patient presents with  . Wound Infection     (Consider location/radiation/quality/duration/timing/severity/associated sxs/prior Treatment) HPI Patient is a 68 year old male with past medical history ofdiabetes, hypertension, gout who presents the ER complaining of multiple sacral wounds as well has multiple foot wounds on multiple toes. Patient states his sacral wounds haven't present for approximately one year, and have "come and gone for a year". Patient states his sacral wounds have become worse over the past week, and he has also noticed multiple wounds on his feet which has become worse over the past week after they were being rubbed with a bandage over them. Patient denies numbness, weakness, tingling, blurred vision, dizziness, headache, nausea, vomiting, fever, chest pain, shortness of breath, abdominal pain, diarrhea, dysuria.  Past Medical History  Diagnosis Date  . Diabetes mellitus   . Hypertension   . Gout   . Seasonal allergies   . Sleep apnea    Past Surgical History  Procedure Laterality Date  . Pituitary excision    . Laparoscopic gastric banding    . Circumcision    . Cataract extraction     History reviewed. No pertinent family history. History  Substance Use Topics  . Smoking status: Never Smoker   . Smokeless tobacco: Never Used  . Alcohol Use: No    Review of Systems  Constitutional: Negative for fever.  HENT: Negative for trouble swallowing.   Eyes: Negative for visual disturbance.  Respiratory: Negative for shortness of breath.   Cardiovascular: Negative for chest pain.  Gastrointestinal: Negative for nausea, vomiting and abdominal pain.  Genitourinary: Negative for dysuria.  Musculoskeletal: Negative for neck pain.  Skin: Positive for wound. Negative for rash.  Neurological: Negative for dizziness, weakness  and numbness.  Psychiatric/Behavioral: Negative.       Allergies  Review of patient's allergies indicates no known allergies.  Home Medications   Prior to Admission medications   Medication Sig Start Date End Date Taking? Authorizing Provider  allopurinol (ZYLOPRIM) 300 MG tablet Take 300 mg by mouth daily.   Yes Historical Provider, MD  aspirin EC 81 MG tablet Take 81 mg by mouth daily.   Yes Historical Provider, MD  furosemide (LASIX) 40 MG tablet Take 40 mg by mouth daily.   Yes Historical Provider, MD  HYDROcodone-acetaminophen (NORCO) 7.5-325 MG per tablet Take 1 tablet by mouth every 6 (six) hours as needed for moderate pain.   Yes Historical Provider, MD  insulin NPH-regular Human (NOVOLIN 70/30) (70-30) 100 UNIT/ML injection Inject 25-70 Units into the skin 2 (two) times daily with a meal. 70 units in am and 25 units in the pm.   Yes Historical Provider, MD  metFORMIN (GLUCOPHAGE) 1000 MG tablet Take 1,000 mg by mouth 2 (two) times daily with a meal.   Yes Historical Provider, MD  Metolazone (ZAROXOLYN PO) Take 1 tablet by mouth daily. Patient does not know dose.   Yes Historical Provider, MD  metoprolol (LOPRESSOR) 50 MG tablet Take 50 mg by mouth 2 (two) times daily.   Yes Historical Provider, MD  mupirocin ointment (BACTROBAN) 2 % Place 1 application into the nose 3 (three) times daily.   Yes Historical Provider, MD  Olmesartan-Amlodipine-HCTZ (TRIBENZOR) 20-5-12.5 MG TABS Take 1 tablet by mouth daily.   Yes Historical Provider, MD  phentermine 37.5 MG capsule Take 37.5 mg by  mouth daily.   Yes Historical Provider, MD  zolpidem (AMBIEN) 10 MG tablet Take 10 mg by mouth at bedtime as needed for sleep.   Yes Historical Provider, MD  amoxicillin-clavulanate (AUGMENTIN) 875-125 MG per tablet Take 1 tablet by mouth 2 (two) times daily. One po bid x 7 days 05/23/14   Carrie Mew, PA-C  doxycycline (VIBRAMYCIN) 100 MG capsule Take 1 capsule (100 mg total) by mouth 2 (two) times daily.  11/02/12   Malvin Johns, MD  insulin NPH-regular (NOVOLIN 70/30) (70-30) 100 UNIT/ML injection Inject into the skin.    Historical Provider, MD  levofloxacin (LEVAQUIN) 500 MG tablet Take 1 tablet (500 mg total) by mouth daily. 05/29/13   Tanna Furry, MD  METFORMIN HCL PO Take by mouth.    Historical Provider, MD  metroNIDAZOLE (FLAGYL) 500 MG tablet Take 1 tablet (500 mg total) by mouth 2 (two) times daily. 05/29/13   Tanna Furry, MD  oxyCODONE-acetaminophen (PERCOCET/ROXICET) 5-325 MG per tablet Take 1-2 tablets by mouth every 6 (six) hours as needed. 05/23/14   Carrie Mew, PA-C  TORSEMIDE PO Take by mouth.    Historical Provider, MD  UNKNOWN TO PATIENT Pt did not bring in meds-wife was able to list some-not  all-knows he takes BP meds    Historical Provider, MD  Valsartan (DIOVAN PO) Take by mouth.    Historical Provider, MD  zolpidem (AMBIEN) 10 MG tablet Take by mouth.    Historical Provider, MD   BP 125/58 mmHg  Pulse 92  Temp(Src) 97.8 F (36.6 C) (Oral)  Resp 24  SpO2 97% Physical Exam  Constitutional: He is oriented to person, place, and time. He appears well-developed and well-nourished. No distress.  Patient is a morbidly obese male sitting in a upright in a wheelchair in no acute distress.  HENT:  Head: Normocephalic and atraumatic.  Mouth/Throat: Oropharynx is clear and moist. No oropharyngeal exudate.  Eyes: Right eye exhibits no discharge. Left eye exhibits no discharge. No scleral icterus.  Neck: Normal range of motion.  Cardiovascular: Normal rate, regular rhythm and normal heart sounds.   No murmur heard. Pulmonary/Chest: Effort normal and breath sounds normal. No respiratory distress.  Abdominal: Soft. There is no tenderness.  Musculoskeletal: Normal range of motion. He exhibits no edema or tenderness.  Neurological: He is alert and oriented to person, place, and time. No cranial nerve deficit. Coordination normal.  Skin: Skin is warm and dry. No rash noted. He is  not diaphoretic.     3 stage II sacral ulcers noted in the region of patient's initial tuberosities bilaterally. No obvious abscess, induration. Also noted are multiple wounds to patient's great toes bilaterally, and lateral borders of fifth toe bilaterally.  Psychiatric: He has a normal mood and affect.  Nursing note and vitals reviewed.   ED Course  Procedures (including critical care time) Labs Review Labs Reviewed  CBC WITH DIFFERENTIAL - Abnormal; Notable for the following:    RBC 3.62 (*)    Hemoglobin 10.1 (*)    HCT 32.8 (*)    All other components within normal limits  BASIC METABOLIC PANEL - Abnormal; Notable for the following:    Glucose, Bld 56 (*)    BUN 43 (*)    Creatinine, Ser 1.60 (*)    GFR calc non Af Amer 43 (*)    GFR calc Af Amer 50 (*)    All other components within normal limits  CBG MONITORING, ED - Abnormal; Notable for the following:  Glucose-Capillary 46 (*)    All other components within normal limits    Imaging Review Dg Foot Complete Left  05/23/2014   CLINICAL DATA:  History of diabetic foot ulcers.  EXAM: LEFT FOOT - COMPLETE 3+ VIEW  COMPARISON:  None.  FINDINGS: There is a soft tissue ulcer overlying the great toe. No underlying bone erosions identified. The bones are diffusely osteopenic. Diabetic vascular type calcifications are identified. No areas of bony destruction noted.  IMPRESSION: 1. Soft tissue ulcer overlying the great toe. No underlying osteomyelitis.   Electronically Signed   By: Kerby Moors M.D.   On: 05/23/2014 18:31   Dg Foot Complete Right  05/23/2014   CLINICAL DATA:  Diabetic foot ulcers.  EXAM: RIGHT FOOT COMPLETE - 3+ VIEW  COMPARISON:  None.  FINDINGS: Soft tissue ulcers are visible over the medial first and lateral fifth digits. No underlying erosive changes or focal joint narrowing suggestive of septic arthritis or osteomyelitis. No spreading subcutaneous gas. No acute fracture or malalignment. Diffuse arterial  calcification.  IMPRESSION: Soft tissue ulcers without visible osteomyelitis.   Electronically Signed   By: Jorje Guild M.D.   On: 05/23/2014 17:32     EKG Interpretation None      MDM   Final diagnoses:  Diabetic foot ulcers  Pressure sore    Patient here complaining of multiple sacral pressure wounds, as well as diabetic foot ulcers bilaterally on multiple toes. Patient states his sacral wounds have been present for over a year, however have waxed and waned. He states over the past week his sacral wounds have become worse. Patient states also the past 2 weeks he has noted multiple foot ulcers.  Initially patient states that these ulcers just appeared, however later reports he was using a bandage on his feet, and he thinks a bandage may have rubbed sores on his feet. Patient's radiographs unremarkable for any acute pathology or osteomyelitis. Patient's lab work unremarkable for acute pathology. Mild elevated renal function, however given patient's history and condition this may be baseline for patient. Patient had an episode of hypoglycemia while in the ER, was given oral juice and food. CBG decreased to 46, after oral glucose CBG raise to 87 mg/dL. Patient discharged and strongly encouraged to follow-up with primary care physician and with the wound clinic. I encouraged warm soaks on patient's sores for his decubitus ulcers as well as the ulcers on his toes. Patient is given antibiotics, and I discussed return precautions with patient. Patient was agreeable to this plan. I encouraged patient to call or return to the ER should he have any worsening of symptoms or should he have any questions or concerns.  BP 125/58 mmHg  Pulse 92  Temp(Src) 97.8 F (36.6 C) (Oral)  Resp 24  SpO2 97%  Signed,  Dahlia Bailiff, PA-C 1:56 AM  This patient seen and discussed with Dr. Daleen Bo, M.D.  Carrie Mew, PA-C 05/24/14 0157  Richarda Blade, MD 05/27/14 [phone]

## 2014-05-23 NOTE — ED Notes (Signed)
Pt in c/o multiple pressure ulcers, wounds on bilateral feet, bottom area and possibly rectum, pt states some of them have been going on for the last year, the ones on his feet are new in the last two weeks

## 2014-05-23 NOTE — ED Notes (Signed)
PA at bedside.

## 2014-05-23 NOTE — ED Provider Notes (Signed)
  Face-to-face evaluation   History: complains of sores on posterior scrotum, and toes of both feet, for several days.  Physical exam:morbidly obese, alert, calm, cooperative.  Superficial skin breakdown, upper posterior scrotum, and perineum; consistent with stage 2, pressure sores.  Skin breakdown of multiple toes on both feet, dorsally, pectoralis minor, ulcerations, with granulation tissue, but no apparent infection.  Feet are nontender to palpation and they are sensate.  Medical screening examination/treatment/procedure(s) were conducted as a shared visit with non-physician practitioner(s) and myself.  I personally evaluated the patient during the encounter  Richarda Blade, MD 05/27/14 1251

## 2014-05-27 LAB — CBG MONITORING, ED: Glucose-Capillary: 87 mg/dL (ref 70–99)

## 2014-07-24 ENCOUNTER — Ambulatory Visit (HOSPITAL_BASED_OUTPATIENT_CLINIC_OR_DEPARTMENT_OTHER): Payer: Medicare Other | Attending: Medical

## 2014-10-20 ENCOUNTER — Ambulatory Visit (HOSPITAL_BASED_OUTPATIENT_CLINIC_OR_DEPARTMENT_OTHER): Payer: Medicare Other | Attending: Medical

## 2015-02-26 ENCOUNTER — Ambulatory Visit (HOSPITAL_BASED_OUTPATIENT_CLINIC_OR_DEPARTMENT_OTHER): Payer: Medicare Other | Attending: Pulmonary Disease

## 2015-02-26 VITALS — Ht 72.0 in | Wt >= 6400 oz

## 2015-02-26 DIAGNOSIS — G4700 Insomnia, unspecified: Secondary | ICD-10-CM | POA: Insufficient documentation

## 2015-02-26 DIAGNOSIS — G4736 Sleep related hypoventilation in conditions classified elsewhere: Secondary | ICD-10-CM | POA: Diagnosis not present

## 2015-02-26 DIAGNOSIS — R0683 Snoring: Secondary | ICD-10-CM | POA: Diagnosis not present

## 2015-02-26 DIAGNOSIS — M549 Dorsalgia, unspecified: Secondary | ICD-10-CM | POA: Insufficient documentation

## 2015-02-26 DIAGNOSIS — G4733 Obstructive sleep apnea (adult) (pediatric): Secondary | ICD-10-CM

## 2015-02-26 DIAGNOSIS — J9612 Chronic respiratory failure with hypercapnia: Secondary | ICD-10-CM

## 2015-03-01 DIAGNOSIS — J9612 Chronic respiratory failure with hypercapnia: Secondary | ICD-10-CM

## 2015-03-01 DIAGNOSIS — G4733 Obstructive sleep apnea (adult) (pediatric): Secondary | ICD-10-CM

## 2015-03-01 NOTE — Progress Notes (Signed)
  Patient Name: Kyle, Walker Date: 02/26/2015 Gender: Male D.O.B: Jan 14, 1946 Age (years): 69 Referring Provider: Loletha Carrow Height (inches): 72 Interpreting Physician: Baird Lyons MD, ABSM Weight (lbs): 480 RPSGT: Joni Reining BMI: 65 MRN: [id number] Neck Size: 22.00 CLINICAL INFORMATION Sleep Study Type: NPSG  Indication for sleep study: N/A  Epworth Sleepiness Score: 12  SLEEP STUDY TECHNIQUE As per the AASM Manual for the Scoring of Sleep and Associated Events v2.3 (April 2016) with a hypopnea requiring 4% desaturations.  The channels recorded and monitored were frontal, central and occipital EEG, electrooculogram (EOG), submentalis EMG (chin), nasal and oral airflow, thoracic and abdominal wall motion, anterior tibialis EMG, snore microphone, electrocardiogram, and pulse oximetry.  MEDICATIONS Patient's medications include: Charted for review. Medications self-administered by patient during sleep study : No sleep medicine administered.  SLEEP ARCHITECTURE The study was initiated at 11:07:36 PM and ended at 5:10:16 AM.  Sleep onset time was 13.2 minutes and the sleep efficiency was 30.5%. The total sleep time was 110.5 minutes.  Stage REM latency was N/A minutes.  The patient spent 10.86% of the night in stage N1 sleep, 89.14% in stage N2 sleep, 0.00% in stage N3 and 0.00% in REM.  Alpha intrusion was absent.  Supine sleep was 100.00%.  Awake after sleep onset 239 minutes  RESPIRATORY PARAMETERS The overall apnea/hypopnea index (AHI) was 20.6 per hour. There were 1 total apneas, including 1 obstructive, 0 central and 0 mixed apneas. There were 37 hypopneas and 3 RERAs.  The AHI during Stage REM sleep was N/A per hour.  AHI while supine was 20.6 per hour.  The mean oxygen saturation was 89.44%. The minimum SpO2 during sleep was 84.00%.  Total sleep time with room air saturation <= 88% was 27.6 minutes.  Moderate snoring was noted during this  study.  CARDIAC DATA The 2 lead EKG demonstrated sinus rhythm. The mean heart rate was N/A beats per minute. Other EKG findings include: None.  LEG MOVEMENT DATA The total PLMS were 436 with a resulting PLMS index of 236.74. Associated arousal with leg movement index was 10.3 .  IMPRESSIONS Significant difficulty maintaining sleep without medication. Mostly awake after 01:30 AM. He changed from sleeping in bed with wedge to sleeping in wheelchair due to back pain. This prevented use of split CPAP protocol. Moderate obstructive sleep apnea occurred during this study (AHI = 20.6/h). No significant central sleep apnea occurred during this study (CAI = 0.0/h). Severe oxygen desaturation was noted during this study (Min O2 = 84.00%). Total sleep time with room air saturation <= 88% was 27.6 minutes. The patient snored with Moderate snoring volume. No cardiac abnormalities were noted during this study. Severe periodic limb movements of sleep occurred during the study. Associated arousals were significant.   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 Positional therapy avoiding supine position during sleep. Avoid alcohol, sedatives and other CNS depressants that may worsen sleep apnea and disrupt normal sleep architecture. Sleep hygiene should be reviewed to assess factors that may improve sleep quality. Weight management and regular exercise should be initiated or continued if appropriate.  Deneise Lever Diplomate, American Board of Sleep Medicine  ELECTRONICALLY SIGNED ON:  03/01/2015, 11:22 AM [place] PH: [phone]   FX: [phone] [place]

## 2015-05-22 ENCOUNTER — Other Ambulatory Visit (HOSPITAL_COMMUNITY): Payer: Self-pay | Admitting: Urology

## 2015-05-22 DIAGNOSIS — C61 Malignant neoplasm of prostate: Secondary | ICD-10-CM

## 2015-05-28 ENCOUNTER — Encounter (HOSPITAL_COMMUNITY)
Admission: RE | Admit: 2015-05-28 | Discharge: 2015-05-28 | Disposition: A | Discharge status: Home / Self Care | Payer: Medicare Other | Source: Ambulatory Visit | Attending: Urology | Admitting: Urology

## 2015-05-28 DIAGNOSIS — C61 Malignant neoplasm of prostate: Secondary | ICD-10-CM | POA: Insufficient documentation

## 2015-05-28 MED ORDER — TECHNETIUM TC 99M MEDRONATE IV KIT
26.0000 | PACK | Freq: Once | INTRAVENOUS | Status: AC | PRN
  Administered 2015-05-28: 26 via INTRAVENOUS

## 2015-10-18 DEATH — deceased

## 2017-06-03 IMAGING — NM NM BONE WHOLE BODY
6 series · 6 of 6 positions shown · non-contrast
Comparison: CT 04/08/2015.

CLINICAL DATA: Prostate cancer.

EXAM:
NUCLEAR MEDICINE WHOLE BODY BONE SCAN
TECHNIQUE: Whole body anterior and posterior images were obtained approximately
3 hours after intravenous injection of radiopharmaceutical.
RADIOPHARMACEUTICALS:  26.0 mCi 4echnetium-BBm MDP IV

[Series 1: whole body · 2.66mm/px · 1 of 1 slices shown (1 of 2)]
[im 1/1]
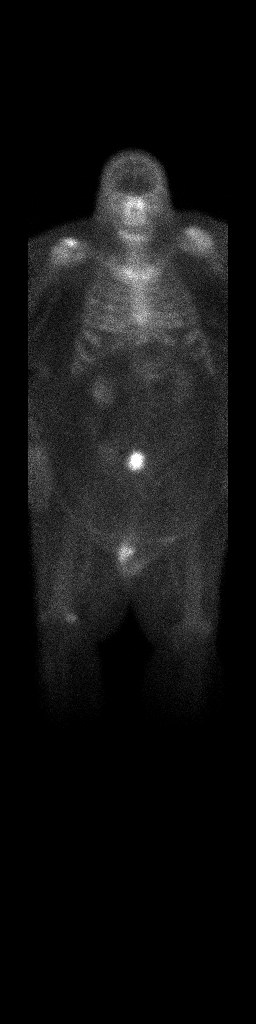

[Series 1: whole body · 2.66mm/px · 1 of 1 slices shown (2 of 2)]
[im 1/1]
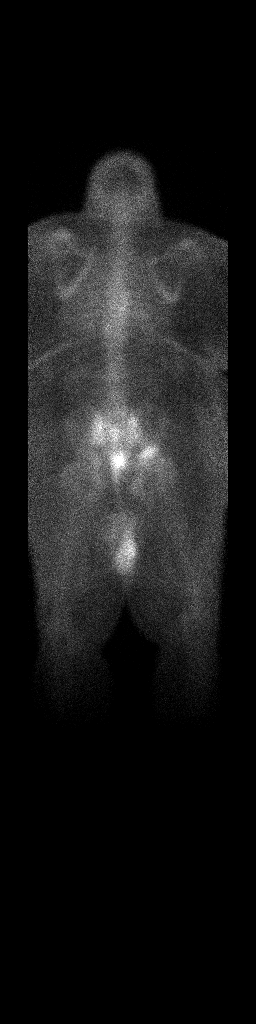

[Series 2: bone statics · 2.07mm/px · 1 of 1 slices shown (1 of 4)]
[im 1/1  full-range]
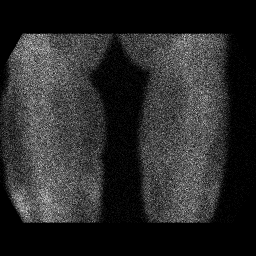

[Series 2: bone statics · 2.07mm/px · 1 of 1 slices shown (2 of 4)]
[im 1/1  full-range]
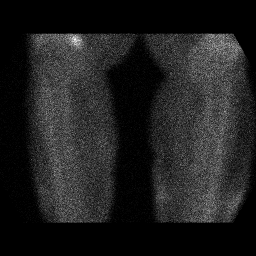

[Series 3: bone statics · 2.07mm/px · 1 of 1 slices shown (3 of 4)]
[im 1/1  full-range]
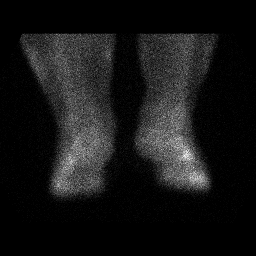

[Series 3: bone statics · 2.07mm/px · 1 of 1 slices shown (4 of 4)]
[im 1/1]
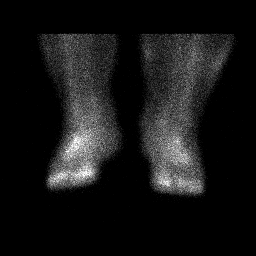

[6 of 6 positions shown; findings below may reference images not displayed]

FINDINGS: This was a limited exam due to patient's size. Increased activity in
the sacrum and right acetabular region/right hip are noted. Focal
metastatic lesions cannot be excluded. Increased activity SI joints
noted, sacroiliitis cannot be excluded. Pelvic, sacrum, and right
hip series suggested for further evaluation of these findings. Mild
increased activity noted about the shoulders, most likely
degenerative. Mild increased activity noted about the ethmoid
sinuses possibly secondary to sinus disease .
IMPRESSION: Limited exam due to patient's size. Increased activity noted over
the sacrum, right acetabulum/hip, both SI joints. Plain film series
of the pelvis, sacrum, and right hip suggested.
